# Patient Record
Sex: Female | Born: 2005 | Race: Asian | Hispanic: No | Marital: Single | Smoking: Never smoker
Health system: Southern US, Community
[De-identification: ages and names within clinical notes are randomized; demographics above are authoritative.]

## PROBLEM LIST (undated history)

## (undated) DIAGNOSIS — L709 Acne, unspecified: Secondary | ICD-10-CM

## (undated) HISTORY — DX: Acne, unspecified: L70.9

---
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
## 2023-02-13 ENCOUNTER — Ambulatory Visit: Payer: 59 | Admitting: Family Medicine

## 2023-02-19 ENCOUNTER — Ambulatory Visit: Payer: 59 | Admitting: Family Medicine

## 2023-04-09 ENCOUNTER — Ambulatory Visit (INDEPENDENT_AMBULATORY_CARE_PROVIDER_SITE_OTHER): Payer: 59 | Admitting: Student

## 2023-04-09 ENCOUNTER — Encounter: Payer: Self-pay | Admitting: Student

## 2023-04-09 ENCOUNTER — Other Ambulatory Visit (HOSPITAL_COMMUNITY)
Admission: RE | Admit: 2023-04-09 | Discharge: 2023-04-09 | Disposition: A | Discharge status: Home / Self Care | Payer: 59 | Source: Ambulatory Visit | Attending: Family Medicine | Admitting: Family Medicine

## 2023-04-09 VITALS — BP 115/80 | HR 81 | Ht 60.0 in | Wt 150.0 lb

## 2023-04-09 DIAGNOSIS — Z9189 Other specified personal risk factors, not elsewhere classified: Secondary | ICD-10-CM | POA: Diagnosis not present

## 2023-04-09 DIAGNOSIS — R300 Dysuria: Secondary | ICD-10-CM | POA: Insufficient documentation

## 2023-04-09 LAB — POCT UA - MICROSCOPIC ONLY: WBC, Ur, HPF, POC: NONE SEEN (ref 0–5)

## 2023-04-09 LAB — POCT WET PREP (WET MOUNT)
Clue Cells Wet Prep Whiff POC: NEGATIVE
Trichomonas Wet Prep HPF POC: ABSENT

## 2023-04-09 LAB — POCT URINALYSIS DIP (MANUAL ENTRY)
Bilirubin, UA: NEGATIVE
Glucose, UA: NEGATIVE mg/dL
Ketones, POC UA: NEGATIVE mg/dL
Leukocytes, UA: NEGATIVE
Nitrite, UA: NEGATIVE
Protein Ur, POC: NEGATIVE mg/dL
Spec Grav, UA: 1.025 (ref 1.010–1.025)
Urobilinogen, UA: 0.2 U/dL
pH, UA: 6 (ref 5.0–8.0)

## 2023-04-09 NOTE — Patient Instructions (Addendum)
It was great to see you today! Thank you for choosing [facility] for your primary care.  Today we addressed: We ordered some labs today     Use Azo over the counter  If you haven't already, sign up for My Chart to have easy access to your labs results, and communication with your primary care physician. We are checking some labs today. If they are abnormal, I will call you. If they are normal, I will send you a MyChart message (if it is active) or a letter in the mail. If you do not hear about your labs in the next 2 weeks, please call the office. I recommend that you always bring your medications to each appointment as this makes it easy to ensure you are on the correct medications and helps [place] not miss refills when you need them. Call the clinic at [phone] if your symptoms worsen or you have any concerns. Return in about 4 weeks (around 05/07/2023), or if symptoms worsen or fail to improve. Please arrive 15 minutes before your appointment to ensure smooth check in process.  We appreciate your efforts in making this happen.  Thank you for allowing me to participate in your care, Alfredo Martinez, MD 04/09/2023, 2:48 PM PGY-3, [facility]

## 2023-04-09 NOTE — Progress Notes (Signed)
    SUBJECTIVE:   CHIEF COMPLAINT / HPI:   Pain with Urination  Burning after Sex: - preferred gender of partner: Male  - Medications tried: None  - Last sexual encounter: Last Friday  - Contraception: Uses Plan B intermittently. Would not like pregnancy test. LMP October 20.  - Symptoms include:  Patient reports multiple episodes of burning with urination after sexual intercourse.  She also notes that she has pain with sexual intercourse.  Denies any vaginal discharge, vaginal bleeding that is abnormal, abdominal pain, abdominal fullness.  Denies fever or chills -Patient does report that she has been using cranberry juice and water for hydration during these episodes.  PERTINENT  PMH / PSH: None   OBJECTIVE:   BP 115/80   Pulse 81   Ht 5' (1.524 m)   Wt 150 lb (68 kg)   LMP 03/18/2023 (Exact Date)   SpO2 100%   BMI 29.29 kg/m   General: NAD, pleasant, able to participate in exam Card: RRR Respiratory: No respiratory distress Skin: warm and dry, no rashes noted Psych: Normal affect and mood GU: Chaperone present in the room, small speculum utilized, visualized vaginal walls with scant white discharge.  No rash, ulceration, without other abnormalities.    ASSESSMENT/PLAN:   Assessment & Plan Dysuria Post coital dysuria, possibly UTI.  Continued symptoms may warrant prophylactic antibiotics.  However, for now continue with cranberry juice, water for hydration, Azo over-the-counter.  If symptoms persist, patient was instructed to call me for further discussion.  Obtained UA as well, patient declined urine pregnancy.  Need to discuss contraceptive options on next visit. History of sexual intercourse Patient reports of pain with intercourse that may be secondary to vaginismus, vulvodynia, stress, psychosomatic.  Could also be related to STI, GC/and wet prep obtained today.  Additionally obtain HIV, RPR, hep C. We will update patient with results     Martha Martinez, MD [facility]

## 2023-04-10 LAB — CERVICOVAGINAL ANCILLARY ONLY
Chlamydia: NEGATIVE
Comment: NEGATIVE
Comment: NORMAL
Neisseria Gonorrhea: NEGATIVE

## 2023-04-10 LAB — HEPATITIS C ANTIBODY: Hep C Virus Ab: NONREACTIVE

## 2023-04-10 LAB — HIV ANTIBODY (ROUTINE TESTING W REFLEX): HIV Screen 4th Generation wRfx: NONREACTIVE

## 2023-04-10 LAB — RPR: RPR Ser Ql: NONREACTIVE

## 2023-04-16 ENCOUNTER — Encounter: Payer: Self-pay | Admitting: Student

## 2023-11-06 ENCOUNTER — Encounter: Payer: Self-pay | Admitting: *Deleted

## 2024-06-09 ENCOUNTER — Ambulatory Visit: Admitting: Family Medicine

## 2024-06-09 ENCOUNTER — Ambulatory Visit

## 2024-06-09 DIAGNOSIS — Z111 Encounter for screening for respiratory tuberculosis: Secondary | ICD-10-CM

## 2024-06-09 NOTE — Progress Notes (Signed)
 Patient presents to nurse clinic for PPD placement.  PPD placed in left ventral forearm.  Patient to return on 01/15 to have site read.

## 2024-06-09 NOTE — Progress Notes (Unsigned)
" ° ° °  SUBJECTIVE:   CHIEF COMPLAINT / HPI:   Negative TB test?  PERTINENT  PMH / PSH: ***  OBJECTIVE:   There were no vitals taken for this visit. ***  General: NAD, pleasant, able to participate in exam Cardiac: RRR, no murmurs. Respiratory: CTAB, normal effort, No wheezes, rales or rhonchi Abdomen: Bowel sounds present, nontender, nondistended Extremities: no edema or cyanosis. Skin: warm and dry, no rashes noted Neuro: alert, no obvious focal deficits Psych: Normal affect and mood  ASSESSMENT/PLAN:   No problem-specific Assessment & Plan notes found for this encounter.     Dr. Izetta Nap, DO [facility]    {    This will disappear when note is signed, click to select method of visit    :1} "

## 2024-06-12 ENCOUNTER — Ambulatory Visit: Payer: Self-pay | Admitting: Family Medicine

## 2024-06-12 ENCOUNTER — Ambulatory Visit: Payer: Self-pay

## 2024-06-12 DIAGNOSIS — Z111 Encounter for screening for respiratory tuberculosis: Secondary | ICD-10-CM

## 2024-06-12 LAB — TB SKIN TEST
Induration: 0 mm
TB Skin Test: NEGATIVE

## 2024-06-12 NOTE — Progress Notes (Signed)
PPD Reading Note PPD read and results entered in EpicCare. Result: 0 mm induration. Interpretation: Negative Allergic reaction: No
